# Patient Record
Sex: Male | Born: 1975 | Race: White | Hispanic: No | Marital: Single | Smoking: Current every day smoker
Health system: Southern US, Community
[De-identification: ages and names within clinical notes are randomized; demographics above are authoritative.]

## PROBLEM LIST (undated history)

## (undated) DIAGNOSIS — I77810 Thoracic aortic ectasia: Secondary | ICD-10-CM

## (undated) DIAGNOSIS — Z62810 Personal history of physical and sexual abuse in childhood: Secondary | ICD-10-CM

## (undated) DIAGNOSIS — E041 Nontoxic single thyroid nodule: Secondary | ICD-10-CM

## (undated) DIAGNOSIS — I10 Essential (primary) hypertension: Secondary | ICD-10-CM

## (undated) DIAGNOSIS — F32A Depression, unspecified: Secondary | ICD-10-CM

## (undated) DIAGNOSIS — F419 Anxiety disorder, unspecified: Secondary | ICD-10-CM

## (undated) DIAGNOSIS — R109 Unspecified abdominal pain: Secondary | ICD-10-CM

## (undated) DIAGNOSIS — R10A0 Flank pain, unspecified side: Secondary | ICD-10-CM

## (undated) HISTORY — DX: Unspecified abdominal pain: R10.9

## (undated) HISTORY — DX: Personal history of physical and sexual abuse in childhood: Z62.810

## (undated) HISTORY — DX: Depression, unspecified: F32.A

## (undated) HISTORY — DX: Thoracic aortic ectasia: I77.810

## (undated) HISTORY — DX: Essential (primary) hypertension: I10

## (undated) HISTORY — DX: Nontoxic single thyroid nodule: E04.1

## (undated) HISTORY — DX: Anxiety disorder, unspecified: F41.9

## (undated) HISTORY — DX: Flank pain, unspecified side: R10.A0

---

## 2020-06-02 ENCOUNTER — Emergency Department (HOSPITAL_COMMUNITY): Payer: No Typology Code available for payment source

## 2020-06-02 ENCOUNTER — Encounter (HOSPITAL_COMMUNITY): Payer: Self-pay

## 2020-06-02 ENCOUNTER — Emergency Department (HOSPITAL_COMMUNITY)
Admission: EM | Admit: 2020-06-02 | Discharge: 2020-06-02 | Disposition: A | Discharge status: Home / Self Care | Payer: No Typology Code available for payment source | Attending: Emergency Medicine | Admitting: Emergency Medicine

## 2020-06-02 DIAGNOSIS — R319 Hematuria, unspecified: Secondary | ICD-10-CM | POA: Diagnosis not present

## 2020-06-02 DIAGNOSIS — R1031 Right lower quadrant pain: Secondary | ICD-10-CM | POA: Diagnosis not present

## 2020-06-02 DIAGNOSIS — R918 Other nonspecific abnormal finding of lung field: Secondary | ICD-10-CM | POA: Diagnosis not present

## 2020-06-02 DIAGNOSIS — R109 Unspecified abdominal pain: Secondary | ICD-10-CM

## 2020-06-02 DIAGNOSIS — R110 Nausea: Secondary | ICD-10-CM | POA: Diagnosis not present

## 2020-06-02 LAB — COMPREHENSIVE METABOLIC PANEL
ALT: 23 U/L (ref 0–44)
AST: 21 U/L (ref 15–41)
Albumin: 4.2 g/dL (ref 3.5–5.0)
Alkaline Phosphatase: 67 U/L (ref 38–126)
Anion gap: 9 (ref 5–15)
BUN: 14 mg/dL (ref 6–20)
CO2: 24 mmol/L (ref 22–32)
Calcium: 9.2 mg/dL (ref 8.9–10.3)
Chloride: 105 mmol/L (ref 98–111)
Creatinine, Ser: 0.85 mg/dL (ref 0.61–1.24)
GFR calc Af Amer: >60 mL/min (ref >60)
GFR calc non Af Amer: >60 mL/min (ref >60)
Glucose, Bld: 176 mg/dL — ABNORMAL HIGH (ref 70–99)
Potassium: 3.7 mmol/L (ref 3.5–5.1)
Sodium: 138 mmol/L (ref 135–145)
Total Bilirubin: 0.9 mg/dL (ref 0.3–1.2)
Total Protein: 6.8 g/dL (ref 6.5–8.1)

## 2020-06-02 LAB — CBC WITH DIFFERENTIAL/PLATELET
Abs Immature Granulocytes: 0.02 10*3/uL (ref 0.00–0.07)
Basophils Absolute: 0 10*3/uL (ref 0.0–0.1)
Basophils Relative: 0 %
Eosinophils Absolute: 0.3 10*3/uL (ref 0.0–0.5)
Eosinophils Relative: 4 %
HCT: 46.2 % (ref 39.0–52.0)
Hemoglobin: 15.2 g/dL (ref 13.0–17.0)
Immature Granulocytes: 0 %
Lymphocytes Relative: 36 %
Lymphs Abs: 2.5 10*3/uL (ref 0.7–4.0)
MCH: 29.9 pg (ref 26.0–34.0)
MCHC: 32.9 g/dL (ref 30.0–36.0)
MCV: 90.8 fL (ref 80.0–100.0)
Monocytes Absolute: 0.6 10*3/uL (ref 0.1–1.0)
Monocytes Relative: 9 %
Neutro Abs: 3.6 10*3/uL (ref 1.7–7.7)
Neutrophils Relative %: 51 %
Platelets: 276 10*3/uL (ref 150–400)
RBC: 5.09 MIL/uL (ref 4.22–5.81)
RDW: 12.4 % (ref 11.5–15.5)
WBC: 7.1 10*3/uL (ref 4.0–10.5)
nRBC: 0 % (ref 0.0–0.2)

## 2020-06-02 LAB — URINALYSIS, ROUTINE W REFLEX MICROSCOPIC
Bacteria, UA: NONE SEEN
Bilirubin Urine: NEGATIVE
Glucose, UA: NEGATIVE mg/dL
Ketones, ur: NEGATIVE mg/dL
Leukocytes,Ua: NEGATIVE
Nitrite: NEGATIVE
Protein, ur: NEGATIVE mg/dL
Specific Gravity, Urine: 1.024 (ref 1.005–1.030)
pH: 5 (ref 5.0–8.0)

## 2020-06-02 NOTE — ED Provider Notes (Signed)
[facility] EMERGENCY DEPARTMENT Provider Note   CSN: [id number] Arrival date & time: 06/02/20  1108     History Chief Complaint  Patient presents with  . Flank Pain    Evan Yang is a 44 y.o. male.  The history is provided by the patient and medical records. No language interpreter was used.  Flank Pain This is a new problem. The current episode started more than 2 days ago. The problem occurs constantly. The problem has not changed since onset.Associated symptoms include abdominal pain. Pertinent negatives include no chest pain, no headaches and no shortness of breath. Nothing aggravates the symptoms. Nothing relieves the symptoms. He has tried nothing for the symptoms. The treatment provided no relief.       History reviewed. No pertinent past medical history.  There are no problems to display for this patient.   History reviewed. No pertinent surgical history.     No family history on file.  Social History   Tobacco Use  . Smoking status: Not on file  Substance Use Topics  . Alcohol use: Not on file  . Drug use: Not on file    Home Medications Prior to Admission medications   Not on File    Allergies    Patient has no allergy information on record.  Review of Systems   Review of Systems  Constitutional: Negative for chills, diaphoresis, fatigue and fever.  HENT: Negative for congestion.   Eyes: Negative for visual disturbance.  Respiratory: Negative for cough, chest tightness, shortness of breath and wheezing.   Cardiovascular: Negative for chest pain, palpitations and leg swelling.  Gastrointestinal: Positive for abdominal pain and nausea. Negative for constipation, diarrhea and vomiting.  Genitourinary: Positive for flank pain. Negative for decreased urine volume, dysuria, frequency, penile pain, scrotal swelling and testicular pain.  Musculoskeletal: Positive for back pain. Negative for neck pain and neck stiffness.  Neurological:  Negative for weakness, light-headedness, numbness and headaches.  Psychiatric/Behavioral: Negative for agitation.  All other systems reviewed and are negative.   Physical Exam Updated Vital Signs BP (!) 155/103   Pulse 80   Temp 99.2 F (37.3 C) (Oral)   Resp 15   Ht 6\' 3"  (1.905 m)   Wt 106.1 kg   SpO2 100%   BMI 29.25 kg/m   Physical Exam Vitals and nursing note reviewed.  Constitutional:      General: He is not in acute distress.    Appearance: He is well-developed. He is not ill-appearing, toxic-appearing or diaphoretic.  HENT:     Head: Normocephalic and atraumatic.     Nose: Nose normal. No congestion or rhinorrhea.     Mouth/Throat:     Mouth: Mucous membranes are moist.     Pharynx: No oropharyngeal exudate or posterior oropharyngeal erythema.  Eyes:     Extraocular Movements: Extraocular movements intact.     Conjunctiva/sclera: Conjunctivae normal.     Pupils: Pupils are equal, round, and reactive to light.  Cardiovascular:     Rate and Rhythm: Normal rate and regular rhythm.     Pulses: Normal pulses.     Heart sounds: No murmur heard.   Pulmonary:     Effort: Pulmonary effort is normal. No respiratory distress.     Breath sounds: Normal breath sounds. No wheezing, rhonchi or rales.  Chest:     Chest wall: No tenderness.  Abdominal:     General: Abdomen is flat.     Palpations: Abdomen is soft.  Tenderness: There is abdominal tenderness. There is no right CVA tenderness, left CVA tenderness, guarding or rebound.    Musculoskeletal:        General: No tenderness.     Cervical back: Neck supple. No tenderness.       Back:     Right lower leg: No edema.     Left lower leg: No edema.  Skin:    General: Skin is warm and dry.     Capillary Refill: Capillary refill takes less than 2 seconds.     Findings: No erythema.  Neurological:     General: No focal deficit present.     Mental Status: He is alert.  Psychiatric:        Mood and Affect: Mood  normal.     ED Results / Procedures / Treatments   Labs (all labs ordered are listed, but only abnormal results are displayed) Labs Reviewed  URINALYSIS, ROUTINE W REFLEX MICROSCOPIC - Abnormal; Notable for the following components:      Result Value   APPearance HAZY (*)    Hgb urine dipstick SMALL (*)    All other components within normal limits  COMPREHENSIVE METABOLIC PANEL - Abnormal; Notable for the following components:   Glucose, Bld 176 (*)    All other components within normal limits  URINE CULTURE  CBC WITH DIFFERENTIAL/PLATELET    EKG None  Radiology CT Renal Stone Study  Result Date: 06/02/2020 CLINICAL DATA:  Right-sided flank pain EXAM: CT ABDOMEN AND PELVIS WITHOUT CONTRAST TECHNIQUE: Multidetector CT imaging of the abdomen and pelvis was performed following the standard protocol without IV contrast. COMPARISON:  None. FINDINGS: Lower chest: Nodular ground-glass density in the left lung base. No pleural effusion. Hepatobiliary: No focal liver abnormality is seen. No gallstones, gallbladder wall thickening, or biliary dilatation. Pancreas: Unremarkable. No pancreatic ductal dilatation or surrounding inflammatory changes. Spleen: Normal in size without focal abnormality. Adrenals/Urinary Tract: Adrenal glands are unremarkable. Kidneys are normal, without renal calculi, focal lesion, or hydronephrosis. Bladder is unremarkable. Stomach/Bowel: Stomach is within normal limits. Appendix appears normal. No evidence of bowel wall thickening, distention, or inflammatory changes. Vascular/Lymphatic: Mild aortic atherosclerosis. No aneurysm. No suspicious adenopathy Reproductive: Prostate is unremarkable. Other: Small fat containing left inguinal hernia. No free air or free fluid Musculoskeletal: No acute or significant osseous findings. IMPRESSION: 1. Negative for hydronephrosis or ureteral stone. 2. Nodular foci of ground-glass density in the left lung base suspicious for respiratory  infection/pneumonia. Electronically Signed   By: Jasmine Pang M.D.   On: 06/02/2020 21:51    Procedures Procedures (including critical care time)  Medications Ordered in ED Medications - No data to display  ED Course  I have reviewed the triage vital signs and the nursing notes.  Pertinent labs & imaging results that were available during my care of the patient were reviewed by me and considered in my medical decision making (see chart for details).    MDM Rules/Calculators/A&P                          Evan Yang is a 44 y.o. male with no significant past medical history presents with right sided flank and abdominal pain.  Patient reports that his symptoms have been ongoing for the last 4 days and is very severe at times.  He reports it is up to 10 out of 10 at times but is currently a 7 out of 10.  He reports nausea but no  vomiting.  He denies any trauma.  He reports it starts in his right back and right flank radiates around towards his right lower quadrant.  It does not go all the way to his testicles he denies actual testicle pain or urinary symptoms aside from darkened urine.  He denies any history or family history of kidney stones but he is concerned that could be going on.  He denies fevers or chills.  Denies any URI symptoms.  Denies other complaints.  On my initial exam, patient has been in the emergency department for approximately 9 and half hours prior to my evaluation.  Patient has some right flank tenderness and right lower quadrant tenderness that is mild.  No significant tenderness in his right CVA area.  Lungs clear and exam otherwise unremarkable.  Clinically I am concerned about kidney stone primarily.  We will get a CT stone study and will get urinalysis and labs.  Urinalysis was performed in triage and showed hematuria but no evidence of infection.  Culture will be sent.  He is now on pain medicine at this time but agrees to get a stone study and screening  labs.  Anticipate disposition after work-up is completed.  Laboratory testing returned and was overall reassuring.  CBC and CMP unremarkable with no evidence of increased kidney function.  Urinalysis shows the hemoglobin but does not show infection..  CT scan shows possible groundglass in the left lower lung base however, he denies any fevers, chills congestion, cough, chest pain, shortness of breath.  Do not feel patient has pneumonia expect this may be just another nodule tissue however patient is a smoker and he was informed of these findings.  Patient will follow up with his PCP for repeat imaging and further surveillance of this abnormal lung tissue.  I do not feel he has pneumonia currently but if he develops fevers, chills, or cough, he knows to return.  The CT scan also did not show evidence of stone or appendix problem.  I clinically suspect patient may have already passed a stone given the hemoglobin in the urine and the location and description of the patient's discomfort.  Patient is otherwise feeling better and will take over-the-counter pain medication and will follow up with a PCP.  He understood return precautions and follow-up instructions.  Patient discharged in good condition with improving symptoms.   Final Clinical Impression(s) / ED Diagnoses Final diagnoses:  Right flank pain  Hematuria, unspecified type  Ground glass opacity present on imaging of lung    Rx / DC Orders ED Discharge Orders    None      Clinical Impression: 1. Right flank pain   2. Hematuria, unspecified type   3. Ground glass opacity present on imaging of lung     Disposition: Discharge  Condition: Good  I have discussed the results, Dx and Tx plan with the pt(& family if present). He/she/they expressed understanding and agree(s) with the plan. Discharge instructions discussed at great length. Strict return precautions discussed and pt &/or family have verbalized understanding of the  instructions. No further questions at time of discharge.    New Prescriptions   No medications on file    Follow Up: [facility] [address] [place] [id number] Schedule an appointment as soon as possible for a visit    [facility] EMERGENCY DEPARTMENT [address] [id number] mc [place] [address] [phone]  Briarrose Shor, Gwenyth Allegra, MD 06/02/20 2227

## 2020-06-02 NOTE — ED Triage Notes (Signed)
Pt arrives to ED w/ c/o 10/10 R sided flank pain. Pt endorses nausea, denies vomiting. Pt denies urinary symptoms, denies hx of kidney stones.

## 2020-06-02 NOTE — Discharge Instructions (Signed)
Your history and exam are concerning for kidney stone on the right side causing your discomfort.  Your urinalysis showed hemoglobin which can fit with a kidney stone.  The CT scan fortunately did not show any obstructing stones which makes a suspect he may have already passed a stone and having residual pain.  We suspect this versus abdominal and flank wall pains.  Your other labs are reassuring.  The CT scan did show some abnormal lung tissue which could be suspicious for pneumonia however clinically did not have any pneumonia type symptoms.  Please watch for signs and symptoms of respiratory problems and if you develop any respiratory problems or worsened symptoms, please return to the nearest emergency department.  Please follow-up with a PCP for further surveillance of this lung tissue.

## 2020-06-04 LAB — URINE CULTURE: Culture: NO GROWTH

## 2021-04-10 ENCOUNTER — Ambulatory Visit (HOSPITAL_COMMUNITY): Payer: Self-pay

## 2021-05-08 ENCOUNTER — Other Ambulatory Visit: Payer: Self-pay | Admitting: Family Medicine

## 2021-05-08 DIAGNOSIS — R911 Solitary pulmonary nodule: Secondary | ICD-10-CM

## 2021-05-24 ENCOUNTER — Ambulatory Visit
Admission: RE | Admit: 2021-05-24 | Discharge: 2021-05-24 | Disposition: A | Discharge status: Home / Self Care | Payer: No Typology Code available for payment source | Source: Ambulatory Visit | Attending: Family Medicine | Admitting: Family Medicine

## 2021-05-24 DIAGNOSIS — R911 Solitary pulmonary nodule: Secondary | ICD-10-CM

## 2021-06-28 IMAGING — CT CT RENAL STONE PROTOCOL
2 of 4 series · 17 of 46 positions shown, 19 images · non-contrast
Comparison: None.

CLINICAL DATA: Right-sided flank pain

EXAM:
CT ABDOMEN AND PELVIS WITHOUT CONTRAST
TECHNIQUE: Multidetector CT imaging of the abdomen and pelvis was performed
following the standard protocol without IV contrast.

[Series 3: ap without · axial · non-contrast · 0.79mm/px · z∈[-592,-192]mm · 14 of 91 slices shown, 16 images]
[im 6/91  soft-tissue]
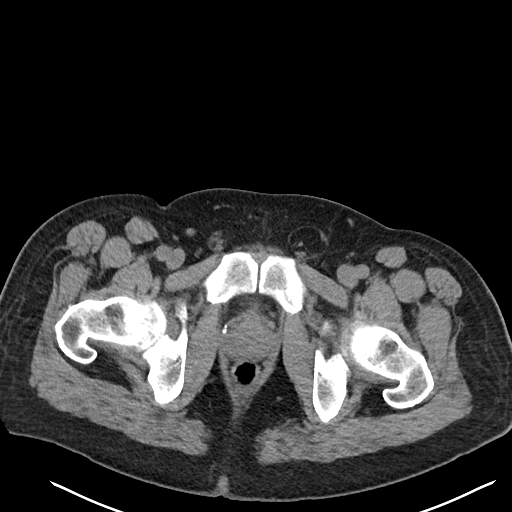
[im 6/91  bone]
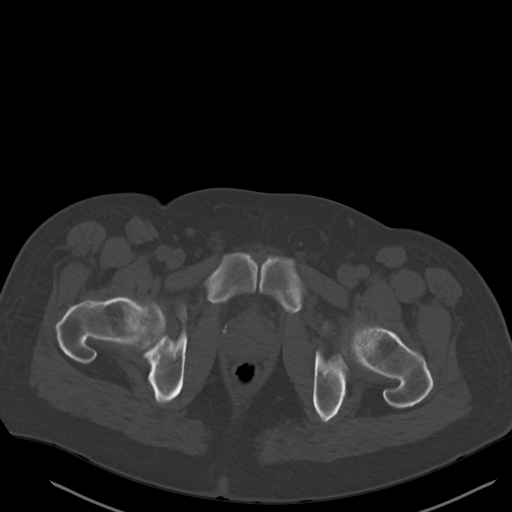
[im 11/91  soft-tissue]
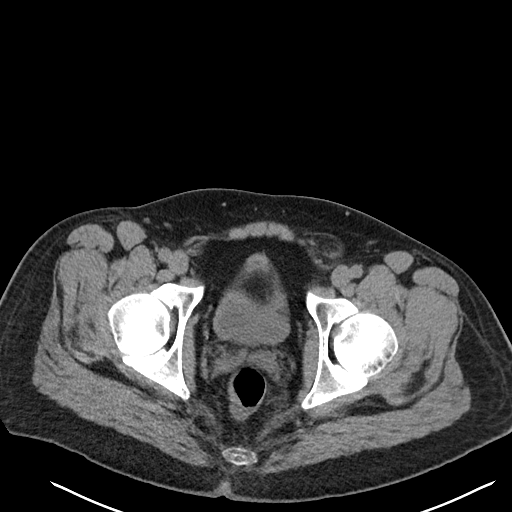
[im 21/91  soft-tissue]
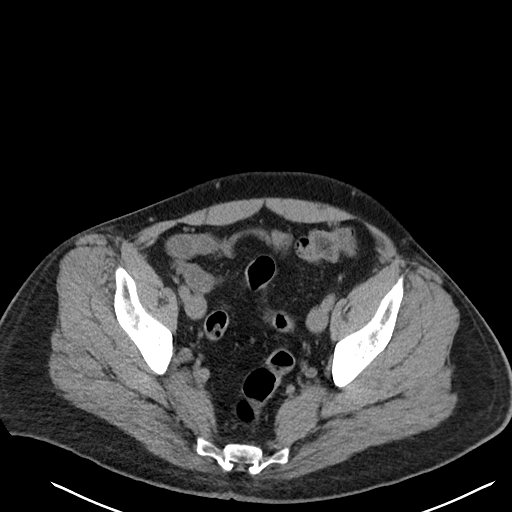
[im 26/91  soft-tissue]
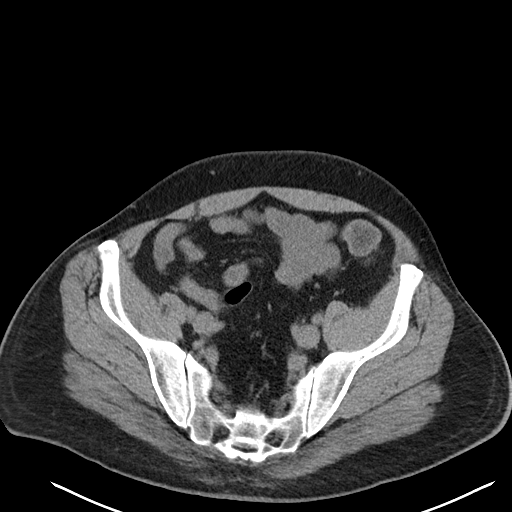
[im 31/91  soft-tissue]
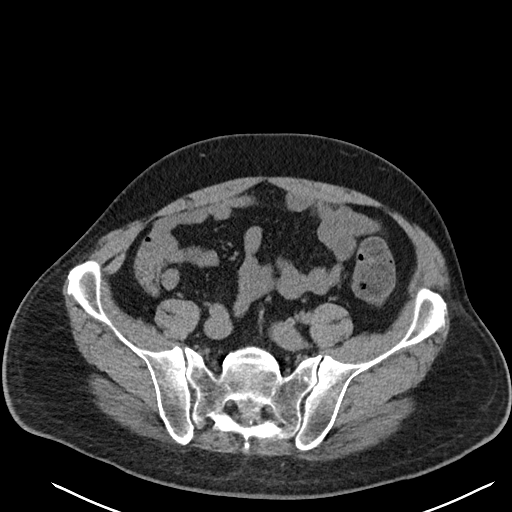
[im 36/91  soft-tissue]
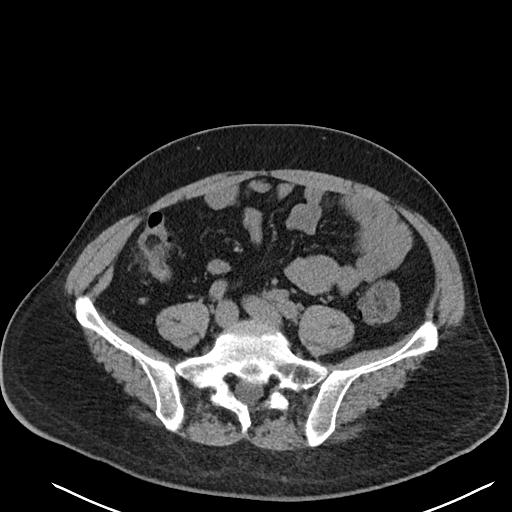
[im 41/91  soft-tissue]
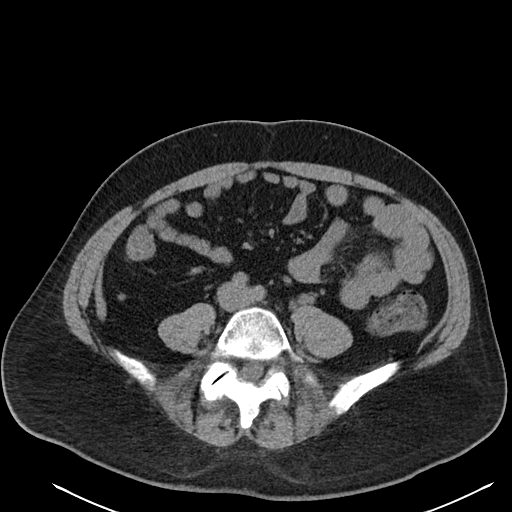
[im 51/91  soft-tissue]
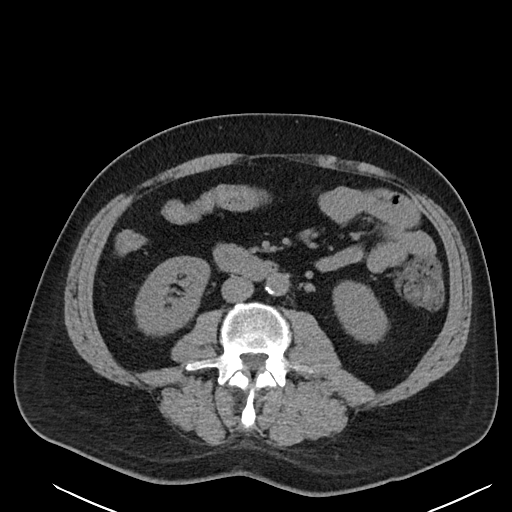
[im 56/91  soft-tissue]
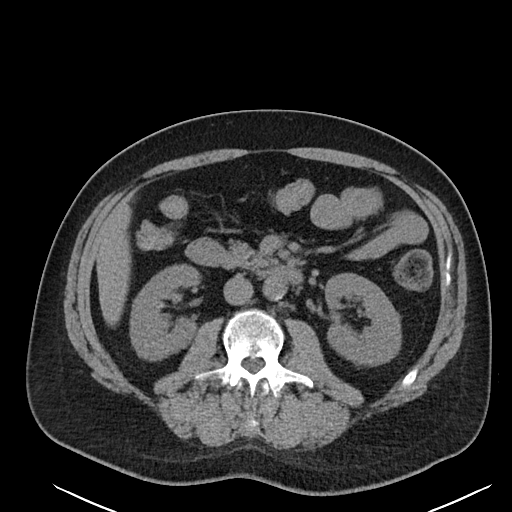
[im 56/91  bone]
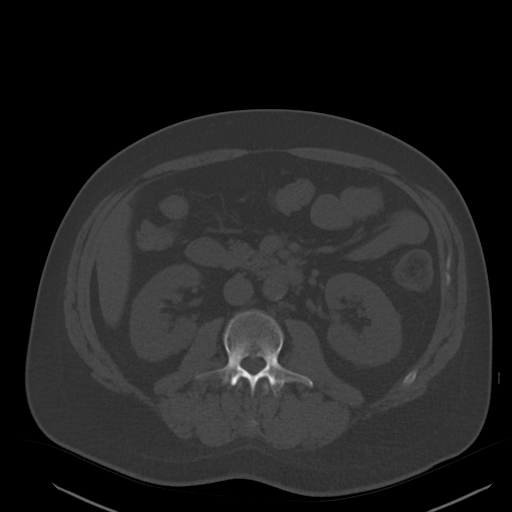
[im 61/91  soft-tissue]
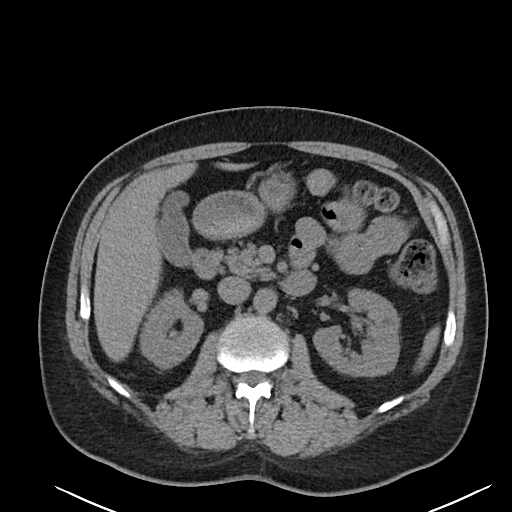
[im 66/91  soft-tissue]
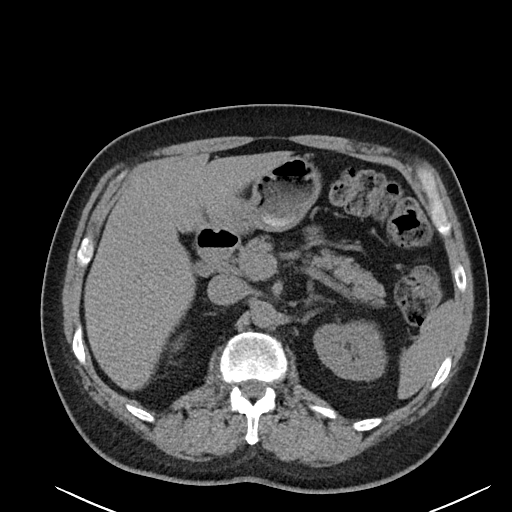
[im 71/91  soft-tissue]
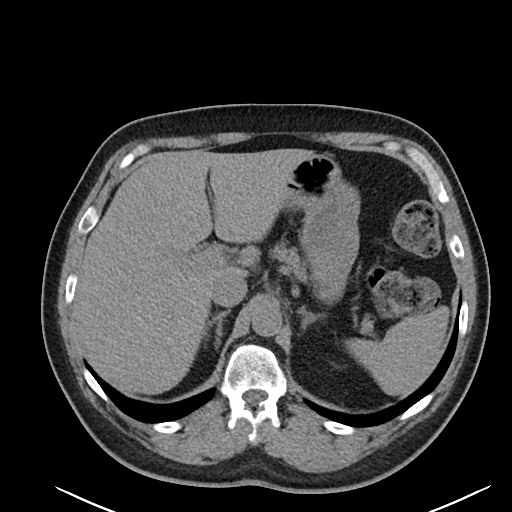
[im 81/91  soft-tissue]
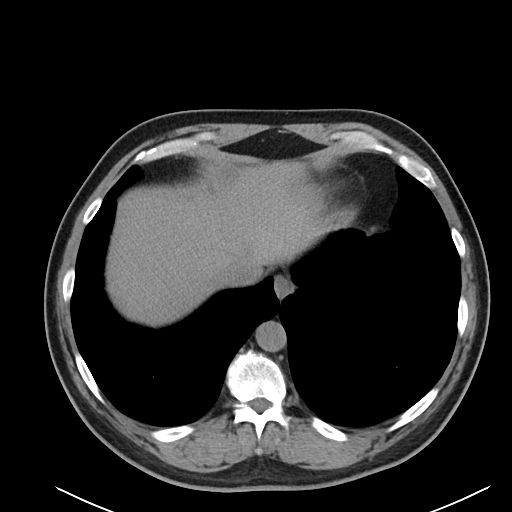
[im 86/91  soft-tissue]
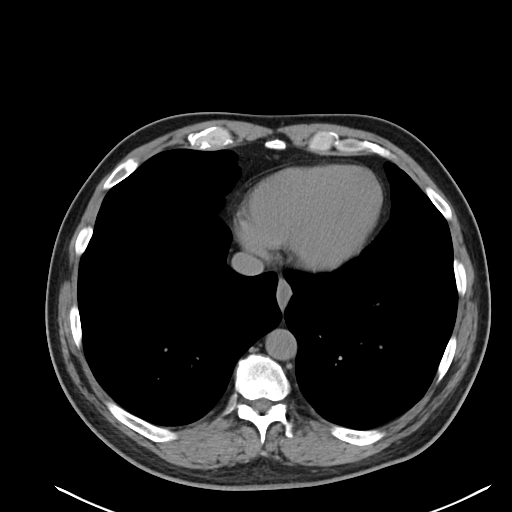

[Series 6: cor · coronal · 0.84mm/px · 3 of 108 slices shown]
[im 36/108  soft-tissue]
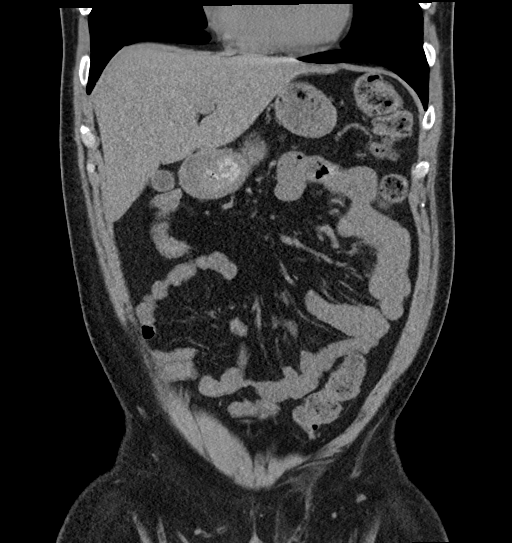
[im 48/108  soft-tissue]
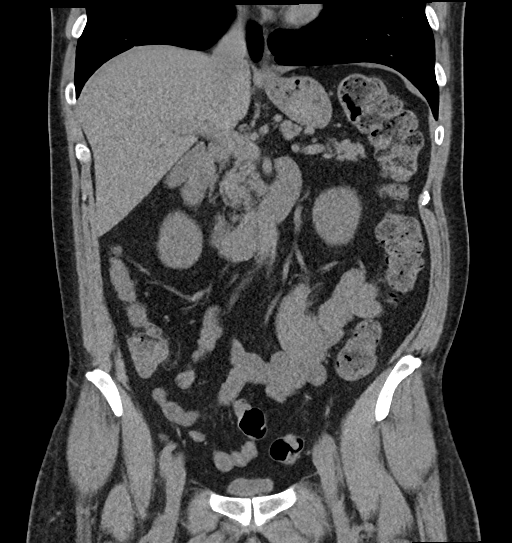
[im 60/108  soft-tissue]
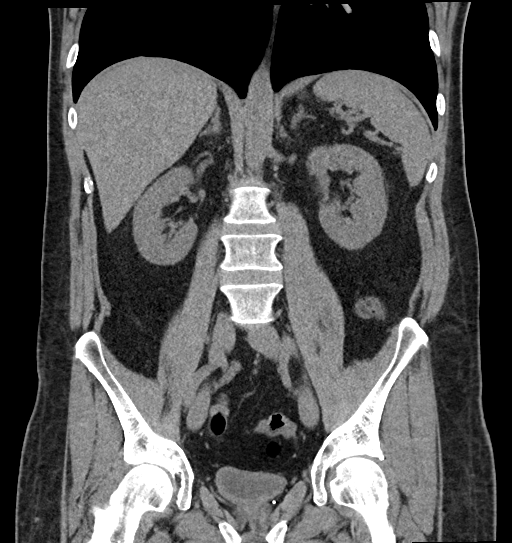

[17 of 46 positions shown; findings below may reference images not displayed]

FINDINGS: Lower chest: Nodular ground-glass density in the left lung base. No
pleural effusion.

Hepatobiliary: No focal liver abnormality is seen. No gallstones,
gallbladder wall thickening, or biliary dilatation.

Pancreas: Unremarkable. No pancreatic ductal dilatation or
surrounding inflammatory changes.

Spleen: Normal in size without focal abnormality.

Adrenals/Urinary Tract: Adrenal glands are unremarkable. Kidneys are
normal, without renal calculi, focal lesion, or hydronephrosis.
Bladder is unremarkable.

Stomach/Bowel: Stomach is within normal limits. Appendix appears
normal. No evidence of bowel wall thickening, distention, or
inflammatory changes.

Vascular/Lymphatic: Mild aortic atherosclerosis. No aneurysm. No
suspicious adenopathy

Reproductive: Prostate is unremarkable.

Other: Small fat containing left inguinal hernia. No free air or
free fluid

Musculoskeletal: No acute or significant osseous findings.
IMPRESSION: 1. Negative for hydronephrosis or ureteral stone.
2. Nodular foci of ground-glass density in the left lung base
suspicious for respiratory infection/pneumonia.

## 2024-02-21 ENCOUNTER — Other Ambulatory Visit: Payer: Self-pay | Admitting: Internal Medicine

## 2024-02-21 DIAGNOSIS — E041 Nontoxic single thyroid nodule: Secondary | ICD-10-CM

## 2024-02-26 ENCOUNTER — Encounter: Payer: Self-pay | Admitting: Internal Medicine

## 2024-02-27 ENCOUNTER — Ambulatory Visit
Admission: RE | Admit: 2024-02-27 | Discharge: 2024-02-27 | Disposition: A | Discharge status: Home / Self Care | Source: Ambulatory Visit | Attending: Internal Medicine | Admitting: Internal Medicine

## 2024-02-27 DIAGNOSIS — E041 Nontoxic single thyroid nodule: Secondary | ICD-10-CM

## 2024-04-09 ENCOUNTER — Other Ambulatory Visit: Payer: Self-pay | Admitting: Physician Assistant

## 2024-04-09 DIAGNOSIS — I77810 Thoracic aortic ectasia: Secondary | ICD-10-CM

## 2024-04-29 ENCOUNTER — Inpatient Hospital Stay: Admission: RE | Admit: 2024-04-29 | Source: Ambulatory Visit

## 2024-04-30 ENCOUNTER — Ambulatory Visit
Admission: RE | Admit: 2024-04-30 | Discharge: 2024-04-30 | Disposition: A | Discharge status: Home / Self Care | Source: Ambulatory Visit | Attending: Physician Assistant | Admitting: Physician Assistant

## 2024-04-30 DIAGNOSIS — I77810 Thoracic aortic ectasia: Secondary | ICD-10-CM

## 2024-04-30 MED ORDER — IOPAMIDOL (ISOVUE-370) INJECTION 76%
80.0000 mL | Freq: Once | INTRAVENOUS | Status: AC | PRN
  Administered 2024-04-30: 80 mL via INTRAVENOUS

## 2024-05-12 ENCOUNTER — Other Ambulatory Visit: Payer: Self-pay | Admitting: Internal Medicine

## 2024-05-12 DIAGNOSIS — E042 Nontoxic multinodular goiter: Secondary | ICD-10-CM

## 2024-05-19 ENCOUNTER — Ambulatory Visit: Attending: Cardiology | Admitting: Cardiology

## 2024-05-19 ENCOUNTER — Encounter: Payer: Self-pay | Admitting: Cardiology

## 2024-05-19 VITALS — BP 154/90 | HR 94 | Ht 75.0 in | Wt 233.6 lb

## 2024-05-19 DIAGNOSIS — E785 Hyperlipidemia, unspecified: Secondary | ICD-10-CM | POA: Diagnosis not present

## 2024-05-19 DIAGNOSIS — Z720 Tobacco use: Secondary | ICD-10-CM | POA: Diagnosis not present

## 2024-05-19 DIAGNOSIS — I77819 Aortic ectasia, unspecified site: Secondary | ICD-10-CM | POA: Diagnosis not present

## 2024-05-19 DIAGNOSIS — I10 Essential (primary) hypertension: Secondary | ICD-10-CM

## 2024-05-19 MED ORDER — ROSUVASTATIN CALCIUM 10 MG PO TABS: 10.0000 mg | ORAL_TABLET | Freq: Every day | ORAL | 3 refills | Status: AC
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
## 2024-05-19 NOTE — Patient Instructions (Addendum)
 Medication Instructions:  Continue current medications Start Rosuvastatin 10 mg daily *If you need a refill on your cardiac medications before your next appointment, please call your pharmacy*  Lab Work: BMET, MG, LIPID PANEL IN 2 MONTHS If you have labs (blood work) drawn today and your tests are completely normal, you will receive your results only by: MyChart Message (if you have MyChart) OR A paper copy in the mail If you have any lab test that is abnormal or we need to change your treatment, we will call you to review the results.  Testing/Procedures: ECHO  Your physician has requested that you have an echocardiogram. Echocardiography is a painless test that uses sound waves to create images of your heart. It provides your doctor with information about the size and shape of your heart and how well your heart's chambers and valves are working. This procedure takes approximately one hour. There are no restrictions for this procedure. Please do NOT wear cologne, perfume, aftershave, or lotions (deodorant is allowed). Please arrive 15 minutes prior to your appointment time.  Please note: We ask at that you not bring children with you during ultrasound (echo/ vascular) testing. Due to room size and safety concerns, children are not allowed in the ultrasound rooms during exams. Our front office staff cannot provide observation of children in our lobby area while testing is being conducted. An adult accompanying a patient to their appointment will only be allowed in the ultrasound room at the discretion of the ultrasound technician under special circumstances. We apologize for any inconvenience.   Follow-Up: At [facility], you and your health needs are our priority.  As part of our continuing mission to provide you with exceptional heart care, our providers are all part of one team.  This team includes your primary Cardiologist (physician) and Advanced Practice Providers or APPs  (Physician Assistants and Nurse Practitioners) who all work together to provide you with the care you need, when you need it.  Your next appointment:   6 month(s)  Provider:   Dr. Alda Amas  We recommend signing up for the patient portal called "MyChart".  Sign up information is provided on this After Visit Summary.  MyChart is used to connect with patients for Virtual Visits (Telemedicine).  Patients are able to view lab/test results, encounter notes, upcoming appointments, etc.  Non-urgent messages can be sent to your provider as well.   To learn more about what you can do with MyChart, go to [email or web address].   Other Instructions Please check blood pressure twice a day for next week and call in with those reading

## 2024-07-10 ENCOUNTER — Other Ambulatory Visit (HOSPITAL_COMMUNITY)

## 2025-05-19 ENCOUNTER — Other Ambulatory Visit (HOSPITAL_COMMUNITY)
